# Patient Record
Sex: Male | Born: 1975 | Hispanic: Yes | Marital: Married | Smoking: Former smoker
Health system: Southern US, Community
[De-identification: ages and names within clinical notes are randomized; demographics above are authoritative.]

## PROBLEM LIST (undated history)

## (undated) DIAGNOSIS — F32A Depression, unspecified: Secondary | ICD-10-CM

## (undated) DIAGNOSIS — S12300A Unspecified displaced fracture of fourth cervical vertebra, initial encounter for closed fracture: Secondary | ICD-10-CM

## (undated) DIAGNOSIS — I82409 Acute embolism and thrombosis of unspecified deep veins of unspecified lower extremity: Secondary | ICD-10-CM

## (undated) DIAGNOSIS — F329 Major depressive disorder, single episode, unspecified: Secondary | ICD-10-CM

## (undated) HISTORY — DX: Major depressive disorder, single episode, unspecified: F32.9

## (undated) HISTORY — DX: Depression, unspecified: F32.A

## (undated) HISTORY — DX: Unspecified displaced fracture of fourth cervical vertebra, initial encounter for closed fracture: S12.300A

## (undated) HISTORY — DX: Acute embolism and thrombosis of unspecified deep veins of unspecified lower extremity: I82.409

---

## 2008-06-27 DIAGNOSIS — S12300A Unspecified displaced fracture of fourth cervical vertebra, initial encounter for closed fracture: Secondary | ICD-10-CM

## 2008-06-27 HISTORY — DX: Unspecified displaced fracture of fourth cervical vertebra, initial encounter for closed fracture: S12.300A

## 2008-06-27 HISTORY — PX: CERVICAL SPINE SURGERY: SHX589

## 2009-12-04 ENCOUNTER — Inpatient Hospital Stay: Payer: Self-pay | Admitting: Internal Medicine

## 2011-09-12 IMAGING — CR DG CHEST 1V PORT
1 series · 1 of 1 positions shown · non-contrast
Comparison: none

REASON FOR EXAM: pneumonia?, recurrent fever
COMMENTS:

PROCEDURE:     DXR - DXR PORTABLE CHEST SINGLE VIEW  - December 06, 2009  [DATE]
RESULT:     No acute abnormality identified. The patient has taken a poor
inspiratory effort with mild bibasilar atelectasis. The cardiovascular
structures are unremarkable.

[view not recorded]
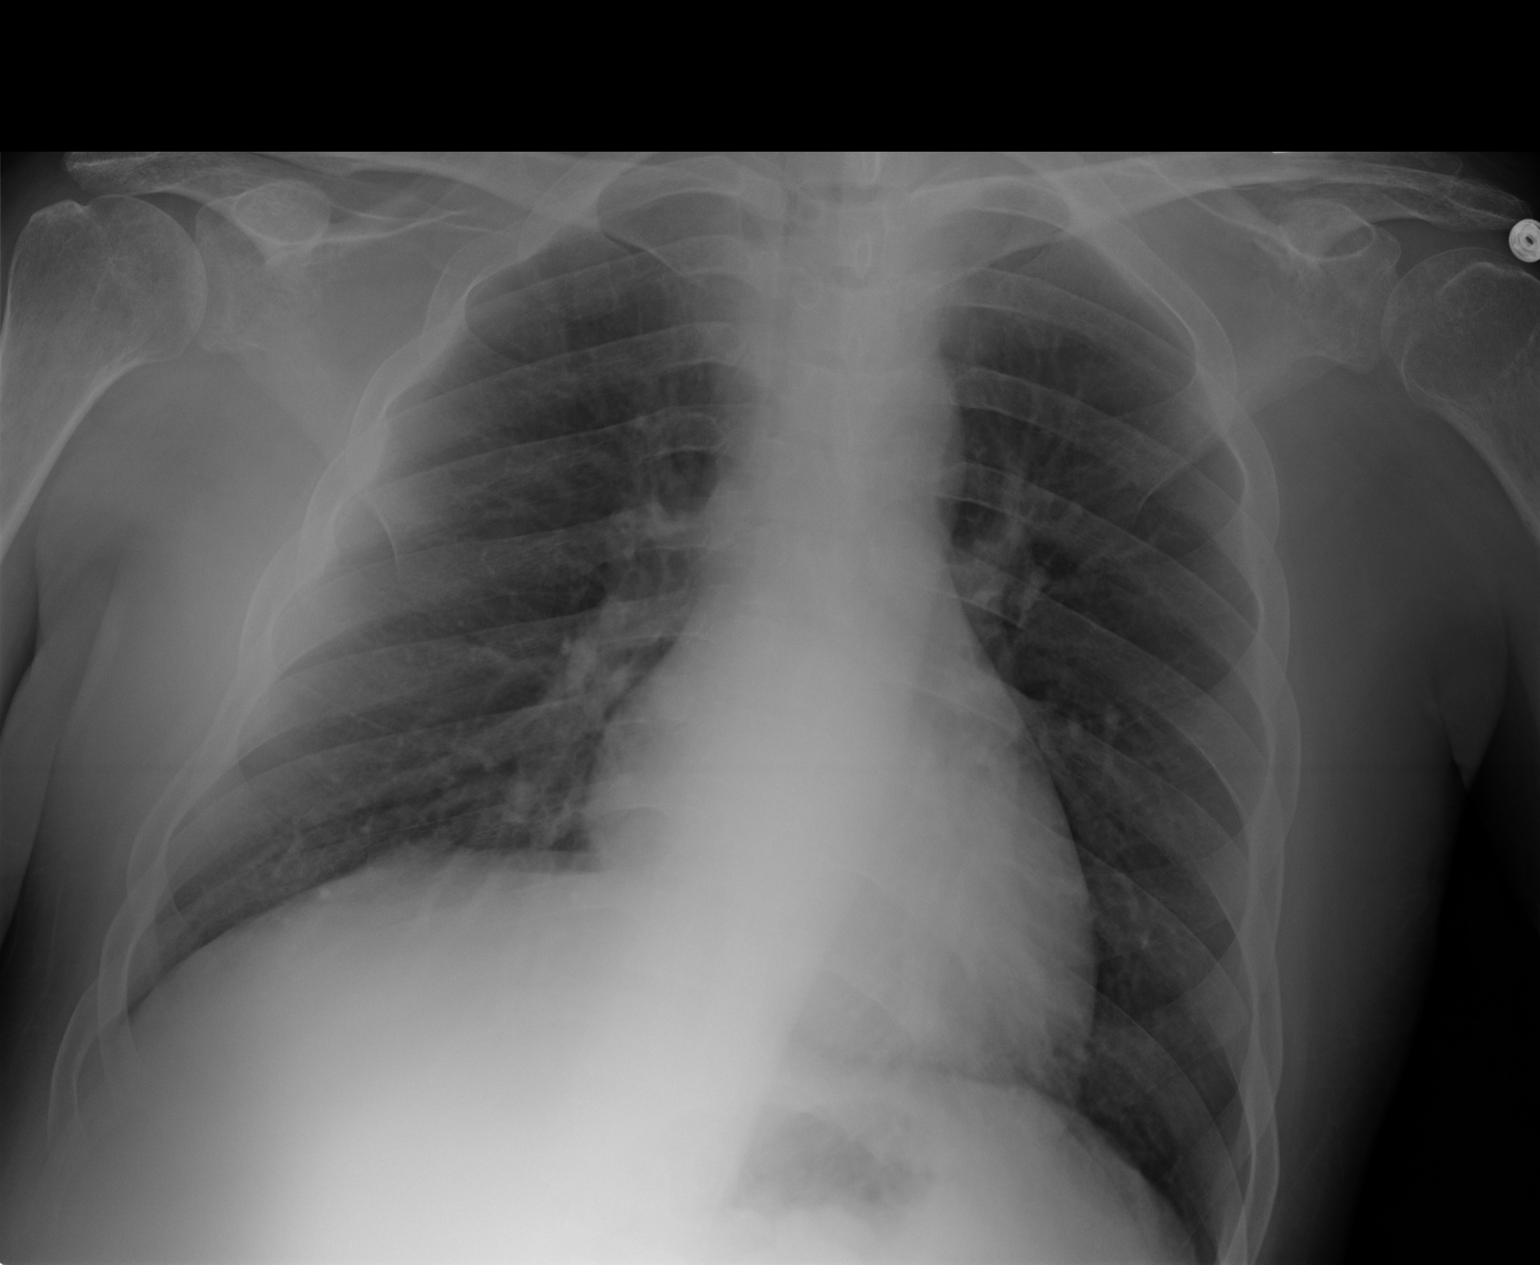

[1 of 1 positions shown; findings below may reference images not displayed]

IMPRESSION: 1. No evidence of alveolar infiltrate. Mild bibasilar atelectasis is noted
most likely secondary to poor inspiratory effort. The cardiovascular
structures are unremarkable.

## 2015-08-25 DIAGNOSIS — Z86718 Personal history of other venous thrombosis and embolism: Secondary | ICD-10-CM | POA: Insufficient documentation

## 2015-08-25 DIAGNOSIS — G904 Autonomic dysreflexia: Secondary | ICD-10-CM | POA: Insufficient documentation

## 2015-08-25 DIAGNOSIS — F329 Major depressive disorder, single episode, unspecified: Secondary | ICD-10-CM | POA: Insufficient documentation

## 2015-08-25 DIAGNOSIS — F32A Depression, unspecified: Secondary | ICD-10-CM | POA: Insufficient documentation

## 2015-08-25 DIAGNOSIS — K639 Disease of intestine, unspecified: Secondary | ICD-10-CM | POA: Insufficient documentation

## 2015-08-25 DIAGNOSIS — R031 Nonspecific low blood-pressure reading: Secondary | ICD-10-CM | POA: Insufficient documentation

## 2015-08-26 ENCOUNTER — Encounter: Payer: Self-pay | Admitting: Family Medicine

## 2015-08-26 ENCOUNTER — Ambulatory Visit (INDEPENDENT_AMBULATORY_CARE_PROVIDER_SITE_OTHER): Payer: Self-pay | Admitting: Family Medicine

## 2015-08-26 VITALS — BP 82/56 | HR 56 | Temp 98.5°F | Resp 16

## 2015-08-26 DIAGNOSIS — N3001 Acute cystitis with hematuria: Secondary | ICD-10-CM

## 2015-08-26 LAB — POCT URINALYSIS DIPSTICK
BILIRUBIN UA: NEGATIVE
Glucose, UA: NEGATIVE
KETONES UA: NEGATIVE
Nitrite, UA: NEGATIVE
PH UA: 6
PROTEIN UA: NEGATIVE
Urobilinogen, UA: 0.2

## 2015-08-26 MED ORDER — SULFAMETHOXAZOLE-TRIMETHOPRIM 800-160 MG PO TABS: 1.0000 | ORAL_TABLET | Freq: Two times a day (BID) | ORAL | Status: DC

## 2015-08-26 NOTE — Progress Notes (Signed)
       Patient: Jared Wells Male    DOB: May 26, 1976   40 y.o.   MRN: [id number] Visit Date: 08/26/2015  Today's Provider: Mila Merry, MD   Chief Complaint  Patient presents with  . Urinary Tract Infection    recheck   Subjective:    Urinary Tract Infection  Episode onset: 6 weeks ago; Seen at [facility] for UTI on 07/13/2015. Treated with Bactrim  twice daily for 10 days. The patient is experiencing no pain. There has been no fever. Pertinent negatives include no chills, nausea, sweats, urgency or vomiting. His past medical history is significant for catheterization.  Patient wife request we recheck his urine to make sure the infection has cleared. She reports seeing sediment in his catheter bag for the past week. Patient denies any fever.      No Known Allergies Previous Medications   IBUPROFEN 200 MG CAPS    Take 3 tablets by mouth every 12 (twelve) hours as needed.    Review of Systems  Constitutional: Positive for fatigue. Negative for fever, chills and appetite change.  Respiratory: Negative for chest tightness, shortness of breath and wheezing.   Cardiovascular: Negative for chest pain and palpitations.  Gastrointestinal: Negative for nausea, vomiting and abdominal pain.  Genitourinary: Negative for urgency.    Social History  Substance Use Topics  . Smoking status: Former Smoker -- 0.75 packs/day for 14 years    Types: Cigarettes    Quit date: 06/27/2008  . Smokeless tobacco: Not on file  . Alcohol Use: 0.0 oz/week    0 Standard drinks or equivalent per week     Comment: drinks 1-2 times a week   Objective:   BP 82/56 mmHg  Pulse 56  Temp(Src) 98.5 F (36.9 C) (Oral)  Resp 16  SpO2 93%  Physical Exam  General appearance: alert, well developed, well nourished, cooperative and in no distress confined to wheelchair Head: Normocephalic, without obvious abnormality, atraumatic Lungs: Respirations even and unlabored Psych: Appropriate mood and  affect. Neurologic: Mental status: Alert, oriented to person, place, and time, thought content appropriate.  Results for orders placed or performed in visit on 08/26/15  POCT urinalysis dipstick  Result Value Ref Range   Color, UA yellow    Clarity, UA clear    Glucose, UA negative    Bilirubin, UA negative    Ketones, UA negative    Spec Grav, UA <=1.005    Blood, UA small hemolyzed    pH, UA 6.0    Protein, UA negative    Urobilinogen, UA 0.2    Nitrite, UA negative    Leukocytes, UA small (1+) (A) Negative       Assessment & Plan:     1. Acute cystitis with hematuria  - sulfamethoxazole-trimethoprim (BACTRIM DS,SEPTRA DS) 800-160 MG tablet; Take 1 tablet by mouth 2 (two) times daily.  Dispense: 20 tablet; Refill: 0 - POCT urinalysis dipstick - Urine Culture       Mila Merry, MD  [facility]

## 2015-08-28 LAB — URINE CULTURE

## 2015-09-24 ENCOUNTER — Telehealth: Payer: Self-pay | Admitting: Family Medicine

## 2015-09-24 DIAGNOSIS — N3001 Acute cystitis with hematuria: Secondary | ICD-10-CM

## 2015-09-24 MED ORDER — SULFAMETHOXAZOLE-TRIMETHOPRIM 800-160 MG PO TABS: 1.0000 | ORAL_TABLET | Freq: Two times a day (BID) | ORAL | Status: DC

## 2015-09-24 NOTE — Telephone Encounter (Signed)
Please advised. Thanks.

## 2015-09-24 NOTE — Telephone Encounter (Signed)
rx sent to [facility]

## 2015-09-24 NOTE — Telephone Encounter (Signed)
Pt wife states pt was seen 3 weeks for a UTI.  Pt has completed all of the medication.  Pt wife states pt urine has a strong odor.  She feels pt may have another UTI and is requesting another Rx.  [facility] [address] Pt does not have insurance so a Rx that is low cost would be better.   GM#[phone]/YQCB#[phone]/MW

## 2016-09-09 ENCOUNTER — Ambulatory Visit (INDEPENDENT_AMBULATORY_CARE_PROVIDER_SITE_OTHER): Payer: Self-pay | Admitting: Family Medicine

## 2016-09-09 VITALS — BP 112/68 | Temp 98.6°F | Resp 16

## 2016-09-09 DIAGNOSIS — N3001 Acute cystitis with hematuria: Secondary | ICD-10-CM

## 2016-09-09 LAB — POCT URINALYSIS DIPSTICK
Bilirubin, UA: NEGATIVE
Glucose, UA: NEGATIVE
Ketones, UA: NEGATIVE
NITRITE UA: POSITIVE
PH UA: 6.5 (ref 5.0–8.0)
SPEC GRAV UA: 1.01 (ref 1.030–1.035)
Urobilinogen, UA: 0.2 (ref ?–2.0)

## 2016-09-09 MED ORDER — CIPROFLOXACIN HCL 500 MG PO TABS: 500.0000 mg | ORAL_TABLET | Freq: Two times a day (BID) | ORAL | 0 refills | Status: DC

## 2016-09-09 NOTE — Progress Notes (Signed)
       Patient: Jared Wells Male    DOB: Mar 13, 1976   41 y.o.   MRN: [id number] Visit Date: 09/09/2016  Today's Provider: Mila Merryonald Fisher, MD   Chief Complaint  Patient presents with  . Urinary Tract Infection   Subjective:    Urinary Tract Infection   This is a new problem. The current episode started 1 to 4 weeks ago. The problem has been gradually worsening. There has been no fever. Associated symptoms include chills, hematuria, nausea and sweats. He has tried nothing for the symptoms. His past medical history is significant for catheterization.       No Known Allergies   Current Outpatient Prescriptions:  .  Ibuprofen 200 MG CAPS, Take 3 tablets by mouth every 12 (twelve) hours as needed., Disp: , Rfl:   Review of Systems  Constitutional: Positive for chills.  Gastrointestinal: Positive for nausea.  Genitourinary: Positive for hematuria.    Social History  Substance Use Topics  . Smoking status: Former Smoker    Packs/day: 0.75    Years: 14.00    Types: Cigarettes    Quit date: 06/27/2008  . Smokeless tobacco: Not on file  . Alcohol use 0.0 oz/week     Comment: drinks 1-2 times a week   Objective:   BP 112/68 (BP Location: Right Arm, Patient Position: Sitting, Cuff Size: Normal)   Temp 98.6 F (37 C)   Resp 16  Vitals:   09/09/16 1553  BP: 112/68  Resp: 16  Temp: 98.6 F (37 C)     Physical Exam  General appearance: alert, well developed, well nourished, cooperative and in no distress. 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 Head: Normocephalic, without obvious abnormality, atraumatic Respiratory: Respirations even and unlabored, normal respiratory rate    Results for orders placed or performed in visit on 09/09/16  POCT urinalysis dipstick  Result Value Ref Range   Color, UA pink    Clarity, UA cloudy    Glucose, UA negative    Bilirubin, UA negative    Ketones, UA negative    Spec Grav, UA 1.010 1.030 - 1.035   Blood, UA hemolyzed large    pH,  UA 6.5 5.0 - 8.0   Protein, UA trace    Urobilinogen, UA 0.2 Negative - 2.0   Nitrite, UA positive    Leukocytes, UA large (3+) (A) Negative       Assessment & Plan:     1. Acute cystitis with hematuria  - Urine culture - ciprofloxacin (CIPRO) 500 MG tablet; Take 1 tablet (500 mg total) by mouth 2 (two) times daily.  Dispense: 60 tablet; Refill: 0 - POCT urinalysis dipstick       Mila Merryonald Fisher, MD  [facility]

## 2016-09-11 LAB — URINE CULTURE

## 2016-09-12 ENCOUNTER — Telehealth: Payer: Self-pay

## 2016-09-12 NOTE — Telephone Encounter (Signed)
-----   Message from Malva Limesonald E Fisher, MD sent at 09/11/2016  9:35 PM EDT ----- Urine culture is positive. Infection should resolve with cipro. Call if not completely resolved when finished.

## 2016-09-12 NOTE — Telephone Encounter (Signed)
Tammi KlippelAngela Vanzee patient's wife was advised as directed below. Marylene Landngela is on the patient's DPR.  Thanks,  -Ellene Bloodsaw

## 2017-05-10 ENCOUNTER — Encounter: Payer: Self-pay | Admitting: Family Medicine

## 2017-05-10 ENCOUNTER — Ambulatory Visit: Payer: Self-pay | Admitting: Family Medicine

## 2017-05-10 VITALS — BP 124/68 | HR 60 | Temp 98.1°F | Resp 16

## 2017-05-10 DIAGNOSIS — N3001 Acute cystitis with hematuria: Secondary | ICD-10-CM

## 2017-05-10 DIAGNOSIS — Z23 Encounter for immunization: Secondary | ICD-10-CM

## 2017-05-10 MED ORDER — CIPROFLOXACIN HCL 500 MG PO TABS: 500.0000 mg | ORAL_TABLET | Freq: Two times a day (BID) | ORAL | 0 refills | Status: AC

## 2017-05-10 NOTE — Addendum Note (Signed)
Addended by: Verdis PrimePRESLEY, RACHELLE L on: 05/10/2017 03:38 PM   Modules accepted: Orders

## 2017-05-10 NOTE — Progress Notes (Signed)
       Patient: Jared Wells Male    DOB: [birth year]   41 y.o.   MRN: [id number] Visit Date: 05/10/2017  Today's Provider: Mila Merryonald Dakoda Bassette, MD   Chief Complaint  Patient presents with  . Urinary Tract Infection   Subjective:    Urinary Tract Infection   This is a chronic problem. The current episode started in the past 7 days. The problem has been gradually worsening. There has been no fever. Associated symptoms include hematuria. Pertinent negatives include no chills, nausea or vomiting. He has tried nothing for the symptoms. His past medical history is significant for recurrent UTIs.   Patient also mentions that his urine is thick and has a strong odor.      No Known Allergies   Current Outpatient Medications:  .  Ibuprofen 200 MG CAPS, Take 3 tablets by mouth every 12 (twelve) hours as needed., Disp: , Rfl:   Review of Systems  Constitutional: Negative for appetite change, chills and fever.  Respiratory: Negative for chest tightness, shortness of breath and wheezing.   Cardiovascular: Negative for chest pain and palpitations.  Gastrointestinal: Negative for abdominal pain, nausea and vomiting.  Genitourinary: Positive for hematuria.    Social History   Tobacco Use  . Smoking status: Former Smoker    Packs/day: 0.75    Years: 14.00    Pack years: 10.50    Types: Cigarettes    Last attempt to quit: 06/27/2008    Years since quitting: 8.8  . Smokeless tobacco: Never Used  Substance Use Topics  . Alcohol use: Yes    Alcohol/week: 0.0 oz    Comment: drinks 1-2 times a week   Objective:   BP 124/68 (BP Location: Right Arm, Patient Position: Sitting, Cuff Size: Normal)   Pulse 60   Temp 98.1 F (36.7 C)   Resp 16   SpO2 98%  Vitals:   05/10/17 1514  BP: 124/68  Pulse: 60  Resp: 16  Temp: 98.1 F (36.7 C)  SpO2: 98%     Physical Exam  General appearance: alert, well developed, well nourished, cooperative and in no distress Head: Normocephalic, without  obvious abnormality, atraumatic Respiratory: Respirations even and unlabored, normal respiratory rate .  Results for orders placed or performed in visit on 09/09/16  Urine culture  Result Value Ref Range   Urine Culture, Routine Final report    Organism ID, Bacteria Comment   POCT urinalysis dipstick  Result Value Ref Range   Color, UA pink    Clarity, UA cloudy    Glucose, UA negative    Bilirubin, UA negative    Ketones, UA negative    Spec Grav, UA 1.010 1.030 - 1.035   Blood, UA hemolyzed large    pH, UA 6.5 5.0 - 8.0   Protein, UA trace    Urobilinogen, UA 0.2 Negative - 2.0   Nitrite, UA positive    Leukocytes, UA large (3+) (A) Negative       Assessment & Plan:     1. Acute cystitis with hematuria  - Urine Culture - ciprofloxacin (CIPRO) 500 MG tablet; Take 1 tablet (500 mg total) 2 (two) times daily for 10 days by mouth.  Dispense: 20 tablet; Refill: 0       Mila Merryonald Shanaye Rief, MD  [facility]

## 2017-05-11 LAB — URINE CULTURE
MICRO NUMBER: 81285049
SPECIMEN QUALITY:: ADEQUATE

## 2017-08-23 ENCOUNTER — Telehealth: Payer: Self-pay | Admitting: Family Medicine

## 2017-08-23 NOTE — Telephone Encounter (Signed)
Patient's wife was advised.  

## 2017-08-23 NOTE — Telephone Encounter (Signed)
Please advise 

## 2017-08-23 NOTE — Telephone Encounter (Signed)
That's fine

## 2017-08-23 NOTE — Telephone Encounter (Signed)
Patient wife called and wanted to know if she can bring in patient urine because she thinks he has a UTI and he is  hard to get in and out

## 2017-08-24 ENCOUNTER — Other Ambulatory Visit (INDEPENDENT_AMBULATORY_CARE_PROVIDER_SITE_OTHER): Payer: Self-pay

## 2017-08-24 DIAGNOSIS — N3001 Acute cystitis with hematuria: Secondary | ICD-10-CM

## 2017-08-24 DIAGNOSIS — N319 Neuromuscular dysfunction of bladder, unspecified: Secondary | ICD-10-CM

## 2017-08-24 LAB — POCT URINALYSIS DIPSTICK
BILIRUBIN UA: NEGATIVE
Glucose, UA: NEGATIVE
KETONES UA: NEGATIVE
Nitrite, UA: NEGATIVE
Protein, UA: NEGATIVE
UROBILINOGEN UA: 0.2 U/dL
pH, UA: 7 (ref 5.0–8.0)

## 2017-08-24 MED ORDER — SULFAMETHOXAZOLE-TRIMETHOPRIM 800-160 MG PO TABS: 1.0000 | ORAL_TABLET | Freq: Two times a day (BID) | ORAL | 0 refills | Status: AC

## 2017-08-24 NOTE — Progress Notes (Signed)
Patient's wife was notified.  

## 2017-08-24 NOTE — Progress Notes (Addendum)
Patient's wife brought urine by to have it checked and per Dr. Sherrie MustacheFisher, we will send it off for C&S. Will call the wife once results are in.   [facility]

## 2017-08-24 NOTE — Addendum Note (Signed)
Addended by: Malva LimesFISHER, Conal Shetley E on: 08/24/2017 12:51 PM   Modules accepted: Orders

## 2017-08-26 LAB — URINE CULTURE

## 2017-08-28 ENCOUNTER — Telehealth: Payer: Self-pay | Admitting: *Deleted

## 2017-08-28 NOTE — Telephone Encounter (Signed)
-----   Message from Malva Limesonald E Fisher, MD sent at 08/27/2017  6:56 PM EST ----- Culture is positive for multiple types of bacteria. Symptoms should resolve on abx that was prescribed last week. Call back if not.

## 2017-08-28 NOTE — Telephone Encounter (Signed)
LMOVM for Angela to return call. 

## 2017-08-29 NOTE — Telephone Encounter (Signed)
Patient's wife Marylene Landngela was notified of results. Expressed understanding.

## 2018-04-16 ENCOUNTER — Telehealth: Payer: Self-pay | Admitting: Family Medicine

## 2018-04-16 DIAGNOSIS — N3001 Acute cystitis with hematuria: Secondary | ICD-10-CM

## 2018-04-16 MED ORDER — CIPROFLOXACIN HCL 500 MG PO TABS: 500.0000 mg | ORAL_TABLET | Freq: Two times a day (BID) | ORAL | 0 refills | Status: AC

## 2018-04-16 NOTE — Telephone Encounter (Signed)
Have sent prescription for ciprofloxacin to [facility]

## 2018-04-16 NOTE — Telephone Encounter (Signed)
Left message advising pt.   Thanks,   -Allure Greaser  

## 2018-04-16 NOTE — Telephone Encounter (Signed)
Pt's wife Marylene Land called saying they are getting ready to go back to [place] and they want to know if they can get an antibiotic for his frequent UTI's.  She states he is ok right now but would like to have one on hand in case he develops one while traveling  [facility]   CB#  [phone]  Thanks  Barth Kirks
